# Patient Record
Sex: Female | Born: 2000 | Race: White | Hispanic: No | Marital: Single | Smoking: Never smoker
Health system: Southern US, Community
[De-identification: ages and names within clinical notes are randomized; demographics above are authoritative.]

## PROBLEM LIST (undated history)

## (undated) DIAGNOSIS — K219 Gastro-esophageal reflux disease without esophagitis: Secondary | ICD-10-CM

## (undated) DIAGNOSIS — M419 Scoliosis, unspecified: Secondary | ICD-10-CM

## (undated) HISTORY — PX: WISDOM TOOTH EXTRACTION: SHX21

## (undated) HISTORY — DX: Gastro-esophageal reflux disease without esophagitis: K21.9

## (undated) HISTORY — DX: Scoliosis, unspecified: M41.9

---
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
## 2022-02-08 DIAGNOSIS — Z3041 Encounter for surveillance of contraceptive pills: Secondary | ICD-10-CM | POA: Diagnosis not present

## 2022-02-22 ENCOUNTER — Other Ambulatory Visit: Payer: Self-pay | Admitting: Gastroenterology

## 2022-02-24 ENCOUNTER — Encounter (INDEPENDENT_AMBULATORY_CARE_PROVIDER_SITE_OTHER): Payer: Self-pay

## 2022-04-26 DIAGNOSIS — M41125 Adolescent idiopathic scoliosis, thoracolumbar region: Secondary | ICD-10-CM | POA: Diagnosis not present

## 2022-09-14 NOTE — Progress Notes (Signed)
Brittney Gross    [id number]    2000/10/25  Primary Care Physician:Tucker, Lanora Manis, MD  Referring Physician: Dahlia Byes, MD [address] [place],  [place] [address]   Chief complaint:  GERD Chief Complaint  Patient presents with   Gastroesophageal Reflux    Prilosec is managing her Sx.Triggered by foods, stress   HPI: 22 year old very pleasant female here for a follow up for chronic GERD. She was last seen on 04-12-21.  Today, she states that her reflux symptoms are about the same. She is currently taking Prilosec 20 mg daily. Her reflux symptoms tends to occur with certain foods such as acidic foods, tomatoes, seasonings. She also states that stress tends to worsen her symptoms. She is also experiencing food regurgitation about once daily or one meal out of three meals. Her reflux and regurgitation symptoms tends to occur with certain foods such as acidic foods, tomatoes, or seasonings. She is also occasionally experiencing cobblestone throat and hoarseness that she believes is due to her reflux.   She also complains of chronic constipation. She has been constipated for about a week now. She is taking Cilium to help relief her symptoms. She is also taking Balance of nature which seems to greatly improve her symptoms   She does report having scoliosis and states that she is considering surgery depending on how her symptoms progress.    Patient denies diarrhea, nausea, blood in stool, black stool, vomiting, abdominal pain, bloating, unintentional weight loss, dysphagia.   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 Gus Puma. They opted to hold off surgery to correct the malrotation  Laryngoscopy 04-02-20 On fiberoptic laryngoscopy the vocal cords are clear bilaterally with  normal vocal cord mobility.  The hypopharynx is clear with no significant  mucosal  abnormalities noted.   EGD 03/18/20 - Normal esophagus. Biopsied. - Normal stomach.Biopsied. No evidence of hiatal hernia. - Normal examined duodenum. Biopsied. -Cobblestoning prior to examination of the GI tract.  Biopsies negative for any specific abnormality, no H. pylori, celiac disease or inflammatory bowel disease  Upper GI series March 03, 2020 Abnormal course of the duodenal sweep and proximal small bowel with the duodenal jejunal junction located to the right of midline. Findings are compatible with malrotation. Possible tiny sliding hiatal hernia. Thoracolumbar levoscoliosis. Otherwise unremarkable upper GI series    She has family history of ulcerative colitis and ankylosing spondylitis in her mother.    Current Outpatient Medications:    amphetamine-dextroamphetamine (ADDERALL XR) 10 MG 24 hr capsule, Take 10 mg by mouth every Monday, Tuesday, Wednesday, Thursday, and Friday. In the morning, Disp: , Rfl:    famotidine (PEPCID) 20 MG tablet, Take 1 tablet (20 mg total) by mouth at bedtime. As needed, Disp: 30 tablet, Rfl: 3   Multiple Vitamin (MULTIVITAMIN) tablet, Take 1 tablet by mouth daily., Disp: , Rfl:    norethindrone-ethinyl estradiol (LOESTRIN) 1-20 MG-MCG tablet, Take 1 tablet by mouth daily., Disp: , Rfl:    omeprazole (PRILOSEC) 20 MG capsule, TAKE 1 CAPSULE BY MOUTH DAILY BEFORE BREAKFAST., Disp: 90 capsule, Rfl: 2   PSYLLIUM HUSK PO, Take 500 mg by mouth at bedtime., Disp: , Rfl:    spironolactone (ALDACTONE) 25 MG tablet, Take 50 mg by mouth once., Disp: , Rfl:    sucralfate (CARAFATE) 1 g tablet, Take 1 tablet (1 g total) by mouth  2 (two) times daily. (Patient taking differently: Take 1 g by mouth 2 (two) times daily as needed.), Disp: 60 tablet, Rfl: 1   VITAMIN D PO, Take by mouth., Disp: , Rfl:     Allergies as of 09/19/2022   (No Known Allergies)    Past Medical History:  Diagnosis Date   Constipation    GERD (gastroesophageal reflux  disease)    Scoliosis     Past Surgical History:  Procedure Laterality Date   BIOPSY  03/18/2020   Procedure: BIOPSY;  Surgeon: Patrica Duel, MD;  Location: [facility] ENDOSCOPY;  Service: Gastroenterology;;   ESOPHAGOGASTRODUODENOSCOPY N/A 03/18/2020   Procedure: ESOPHAGOGASTRODUODENOSCOPY (EGD);  Surgeon: Patrica Duel, MD;  Location[facility] ENDOSCOPY;  Service: Gastroenterology;  Laterality: N/A;   WISDOM TOOTH EXTRACTION      Family History  Problem Relation Age of Onset   Ulcerative colitis Mother    Ankylosing spondylitis Mother    Diverticulitis Other    Irritable bowel syndrome Other    Colon cancer Neg Hx    Esophageal cancer Neg Hx    Rectal cancer Neg Hx     Social History   Socioeconomic History   Marital status: Single    Spouse name: Not on file   Number of children: 0   Years of education: Not on file   Highest education level: Not on file  Occupational History   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   She is unsure what she would like to do after graduation.    Lives with mom, dad, 2 sisters, 1 dog   Social Determinants of Health   Financial Resource Strain: Not on file  Food Insecurity: Not on file  Transportation Needs: Not on file  Physical Activity: Not on file  Stress: Not on file  Social Connections: Not on file  Intimate Partner Violence: Not on file    Review of systems: Review of Systems  Constitutional:  Negative for unexpected weight change.  HENT:  Positive for voice change. Negative for trouble swallowing.   Gastrointestinal:  Positive for constipation. Negative for abdominal distention, abdominal pain, anal  bleeding, blood in stool, diarrhea, nausea, rectal pain and vomiting.       +food regurgitation  +reflux +Cobblestone throat      Physical Exam: There were no vitals filed for this visit. There is no height or weight on file to calculate BMI. General: well-appearing   Eyes: sclera anicteric, no redness ENT: oral mucosa moist without lesions, no cervical or supraclavicular lymphadenopathy CV: RRR, no JVD, no peripheral edema Resp: clear to auscultation bilaterally, normal RR and effort noted GI: soft, no tenderness, with active bowel sounds. No guarding or palpable organomegaly noted. Skin; warm and dry, no rash or jaundice noted Neuro: awake, alert and oriented x 3. Normal gross motor function and fluent speech  Data Reviewed:  Reviewed labs, radiology imaging, old records and pertinent past GI work up   Assessment and Plan/Recommendations: 22 yr old very pleasant female with h/o small bowel malrotation, scoliosis, small sliding hiatal hernia and chronic GERD  Continue with anti reflux measures and dietary modification  Use  Omeprazole 20 mg daily in the AM and pepcid 20mg  in the evening as needed Use Carafate 1 gm before meals and bedtime as needed for breakthrough heartburn and epigastric discomfort We will do a trial of low-dose baclofen at bedtime to see if it improves lower esophageal sphincter tone and decreases regurgitation  Chronic idiopathic constipation: Advised patient to try Benefiber 1 tablespoon 3 times daily with meals instead of Psyllium husk Use stool softener, docusate at bedtime as needed to prevent constipation  Return in 6 months   This visit required 40 minutes of patient care (this includes precharting, chart review, review of results, face-to-face time used for counseling as well as treatment plan and follow-up. The patient was provided an opportunity to ask questions and all were answered. The patient agreed with the plan and demonstrated an  understanding of the instructions.    I,Safa M Kadhim,acting as a scribe for Marsa Aris, MD.,have documented all relevant documentation on the behalf of Marsa Aris, MD,as directed by  Marsa Aris, MD while in the presence of Marsa Aris, MD.   I, Marsa Aris, MD, have reviewed all documentation for this visit. The documentation on 09/14/22 for the exam, diagnosis, procedures, and orders are all accurate and complete.   Iona Beard , MD    CC: Dahlia Byes, MD

## 2022-09-19 ENCOUNTER — Encounter: Payer: Self-pay | Admitting: Gastroenterology

## 2022-09-19 ENCOUNTER — Ambulatory Visit (INDEPENDENT_AMBULATORY_CARE_PROVIDER_SITE_OTHER): Payer: 59 | Admitting: Gastroenterology

## 2022-09-19 VITALS — BP 96/66 | HR 94 | Ht 66.0 in | Wt 124.6 lb

## 2022-09-19 DIAGNOSIS — K5904 Chronic idiopathic constipation: Secondary | ICD-10-CM | POA: Diagnosis not present

## 2022-09-19 DIAGNOSIS — K219 Gastro-esophageal reflux disease without esophagitis: Secondary | ICD-10-CM

## 2022-09-19 MED ORDER — SUCRALFATE 1 G PO TABS: 1.0000 g | ORAL_TABLET | Freq: Three times a day (TID) | ORAL | 3 refills | Status: AC

## 2022-09-19 MED ORDER — FAMOTIDINE 20 MG PO TABS: 20.0000 mg | ORAL_TABLET | Freq: Every day | ORAL | 3 refills | Status: AC

## 2022-09-19 MED ORDER — OMEPRAZOLE 20 MG PO CPDR: 20.0000 mg | DELAYED_RELEASE_CAPSULE | Freq: Every day | ORAL | 3 refills | Status: AC

## 2022-09-19 MED ORDER — BACLOFEN 5 MG PO TABS: 5.0000 mg | ORAL_TABLET | Freq: Every evening | ORAL | 6 refills | Status: AC | PRN

## 2022-09-19 NOTE — Patient Instructions (Signed)
We have sent the following medications to your pharmacy for you to pick up at your convenience: Prilosec,Pepcid,Carafate,Baclofen   Take Benefiber 1 teaspoon three times a day  Use Over the Counter Docusate Sodium every night at bedtime as needed  _______________________________________________________  If your blood pressure at your visit was 140/90 or greater, please contact your primary care physician to follow up on this.  _______________________________________________________  If you are age 20 or older, your body mass index should be between 23-30. Your Body mass index is 20.11 kg/m. If this is out of the aforementioned range listed, please consider follow up with your Primary Care Provider.  If you are age 75 or younger, your body mass index should be between 19-25. Your Body mass index is 20.11 kg/m. If this is out of the aformentioned range listed, please consider follow up with your Primary Care Provider.   ________________________________________________________  The [facility] providers would like to encourage you to use [facility] to communicate with providers for non-urgent requests or questions.  Due to long hold times on the telephone, sending your provider a message by [facility] may be a faster and more efficient way to get a response.  Please allow 48 business hours for a response.  Please remember that this is for non-urgent requests.  _______________________________________________________   I appreciate the  opportunity to care for you  Thank You   Marsa Aris , MD

## 2022-09-20 DIAGNOSIS — H5702 Anisocoria: Secondary | ICD-10-CM | POA: Diagnosis not present

## 2022-09-20 DIAGNOSIS — H52529 Paresis of accommodation, unspecified eye: Secondary | ICD-10-CM | POA: Diagnosis not present

## 2022-09-30 ENCOUNTER — Encounter: Payer: Self-pay | Admitting: Gastroenterology

## 2022-10-17 DIAGNOSIS — Z13220 Encounter for screening for lipoid disorders: Secondary | ICD-10-CM | POA: Diagnosis not present

## 2023-02-02 DIAGNOSIS — Z130 Encounter for screening for diseases of the blood and blood-forming organs and certain disorders involving the immune mechanism: Secondary | ICD-10-CM | POA: Diagnosis not present

## 2023-02-02 DIAGNOSIS — Z01419 Encounter for gynecological examination (general) (routine) without abnormal findings: Secondary | ICD-10-CM | POA: Diagnosis not present

## 2023-02-02 DIAGNOSIS — Z1389 Encounter for screening for other disorder: Secondary | ICD-10-CM | POA: Diagnosis not present

## 2023-02-02 DIAGNOSIS — Z01411 Encounter for gynecological examination (general) (routine) with abnormal findings: Secondary | ICD-10-CM | POA: Diagnosis not present

## 2023-02-02 DIAGNOSIS — Z3041 Encounter for surveillance of contraceptive pills: Secondary | ICD-10-CM | POA: Diagnosis not present

## 2023-02-02 DIAGNOSIS — L989 Disorder of the skin and subcutaneous tissue, unspecified: Secondary | ICD-10-CM | POA: Diagnosis not present

## 2023-03-28 DIAGNOSIS — M41125 Adolescent idiopathic scoliosis, thoracolumbar region: Secondary | ICD-10-CM | POA: Diagnosis not present

## 2023-04-21 DIAGNOSIS — N926 Irregular menstruation, unspecified: Secondary | ICD-10-CM | POA: Diagnosis not present

## 2023-04-21 DIAGNOSIS — L709 Acne, unspecified: Secondary | ICD-10-CM | POA: Diagnosis not present

## 2023-04-21 DIAGNOSIS — K219 Gastro-esophageal reflux disease without esophagitis: Secondary | ICD-10-CM | POA: Diagnosis not present

## 2023-05-13 IMAGING — MR MR HEAD WO/W CM
13 series · 48 of 48 positions shown · IV contrast (multihance)
Comparison: None.

CLINICAL DATA: Anisocoria, rule out midbrain or ocular mass.

History of scoliosis, rule out neck mass.
Family history of neurofibromatosis.
EXAM:
MRI HEAD WITHOUT AND WITH CONTRAST
MRI CERVICAL SPINE WITHOUT AND WITH CONTRAST
TECHNIQUE: Multiplanar, multiecho pulse sequences of the brain and surrounding
structures, and cervical spine, to include the craniocervical
junction and cervicothoracic junction, were obtained without and
with intravenous contrast.
CONTRAST:  10mL MULTIHANCE GADOBENATE DIMEGLUMINE 529 MG/ML IV SOLN

[Series 2: T1 · sagittal · 5.0mm · 0.45mm/px · 1 of 26 slices shown]
[im 1/26]
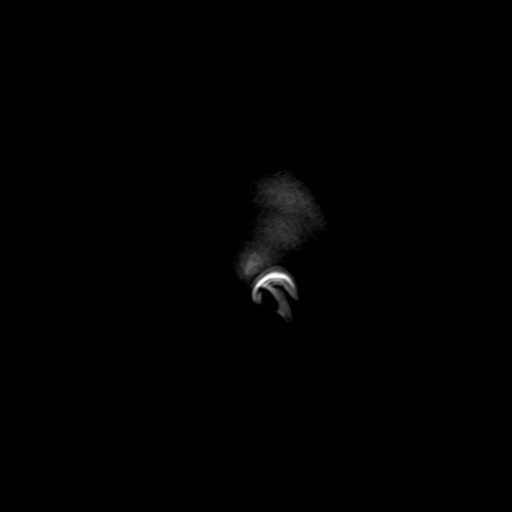

[Series 3: ax ep2d_diff_3 · axial · 3.0mm · 1.88mm/px · z∈[-94,+59]mm · 6 of 108 slices shown]
[im 1/108]
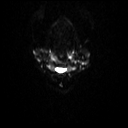
[im 22/108]
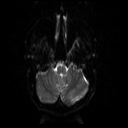
[im 43/108]
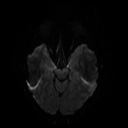
[im 65/108]
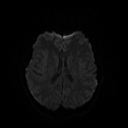
[im 86/108]
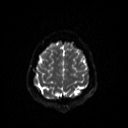
[im 108/108]
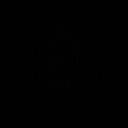

[Series 4: ax ep2d_diff_3_adc · axial · 3.0mm · 1.88mm/px · z∈[-94,+56]mm · 3 of 54 slices shown]
[im 1/54]
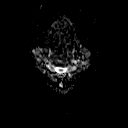
[im 27/54]
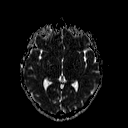
[im 54/54]
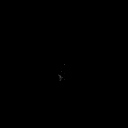

[Series 5: cor ep2d_diff · coronal · 5.0mm · 1.77mm/px · 3 of 57 slices shown]
[im 1/57]
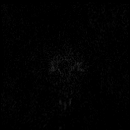
[im 29/57]
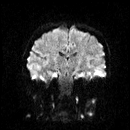
[im 57/57]
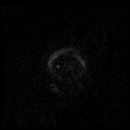

[Series 6: cor ep2d_diff_adc · coronal · 5.0mm · 1.77mm/px · 2 of 28 slices shown]
[im 1/28]
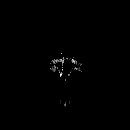
[im 28/28]
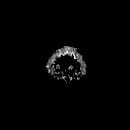

[Series 8: swi_images · axial · 2.0mm · 0.94mm/px · z∈[-93,+57]mm · 5 of 80 slices shown]
[im 1/80]
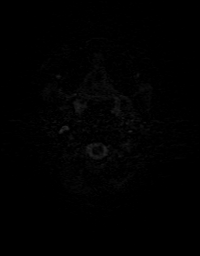
[im 20/80]
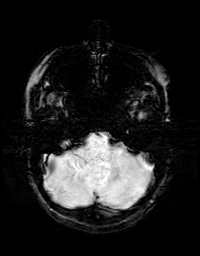
[im 40/80]
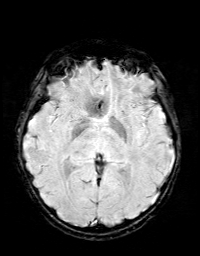
[im 60/80]
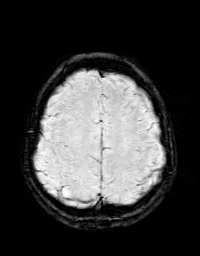
[im 80/80]
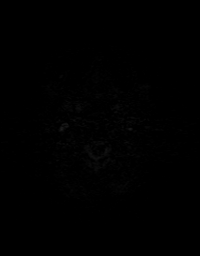

[Series 9: FLAIR · axial · 3.0mm · 0.45mm/px · z∈[-88,+56]mm · 2 of 40 slices shown]
[im 1/40]
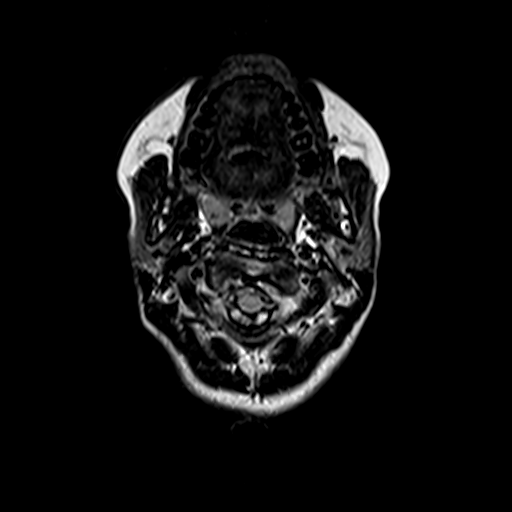
[im 40/40]
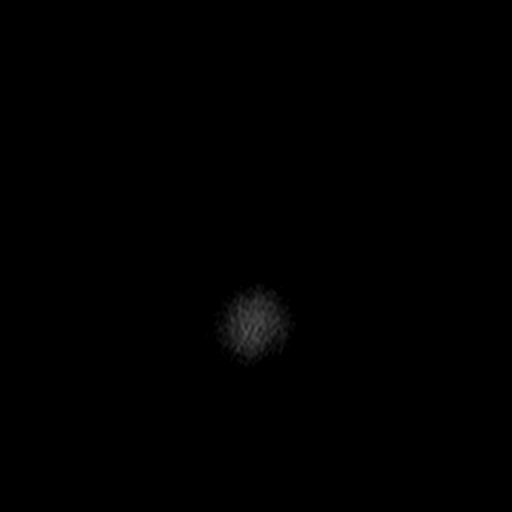

[Series 10: T2 · axial · 5.0mm · 0.62mm/px · z∈[-95,+59]mm · 2 of 28 slices shown]
[im 1/28]
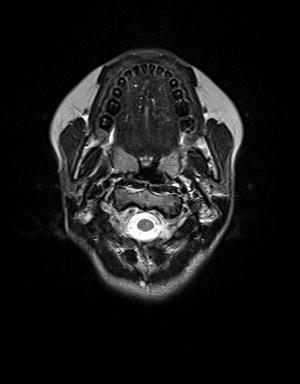
[im 28/28]
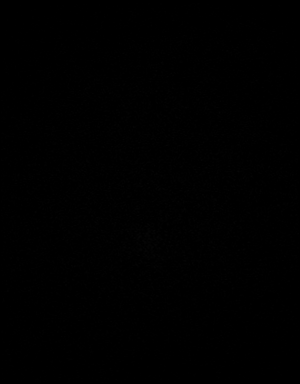

[Series 11: t1_mpr_tra · axial · 1.0mm · 0.75mm/px · z∈[-93,+57]mm · 9 of 160 slices shown]
[im 1/160]
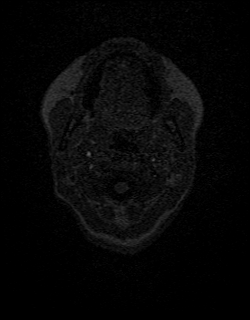
[im 20/160]
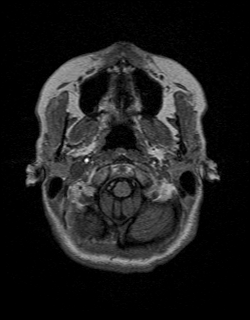
[im 40/160]
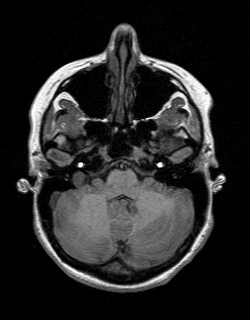
[im 60/160]
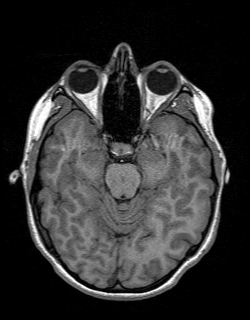
[im 80/160]
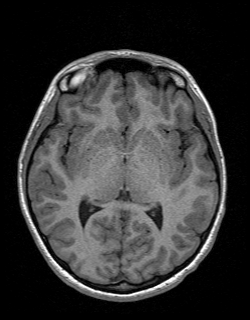
[im 100/160]
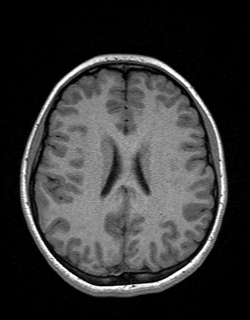
[im 120/160]
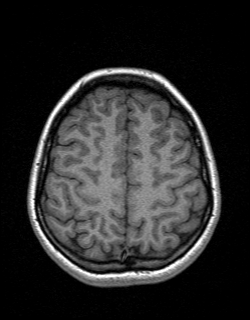
[im 140/160]
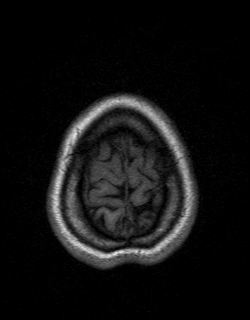
[im 160/160]
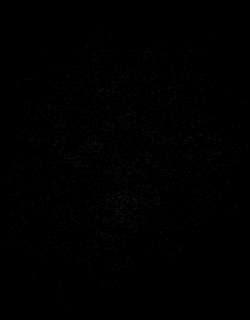

[Series 12: T2 post-contrast · coronal · 5.0mm · 0.45mm/px · 2 of 29 slices shown]
[im 1/29]
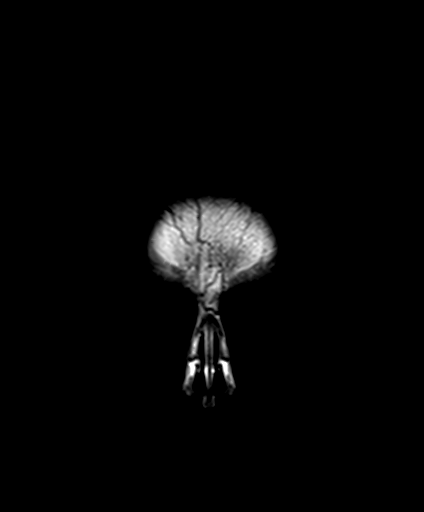
[im 29/29]
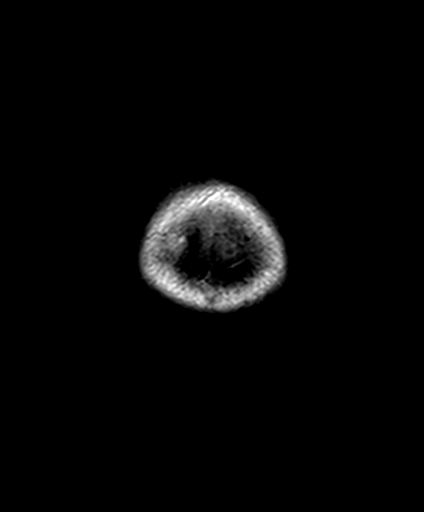

[Series 13: T1 post-contrast · coronal · 5.0mm · 0.90mm/px · 2 of 29 slices shown]
[im 1/29]
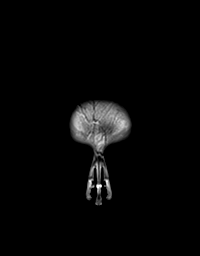
[im 29/29]
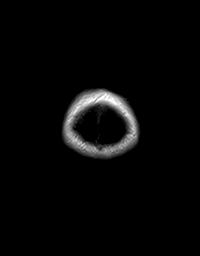

[Series 14: post t1_mpr_tra · axial · 1.0mm · 0.75mm/px · z∈[-93,+57]mm · 9 of 160 slices shown]
[im 1/160]
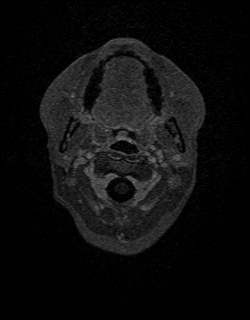
[im 20/160]
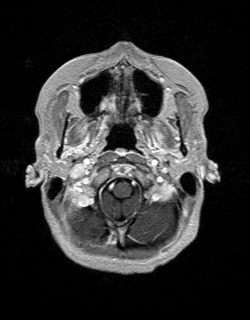
[im 40/160]
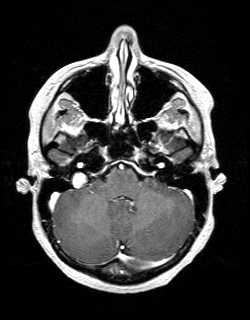
[im 60/160]
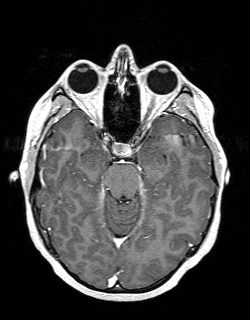
[im 80/160]
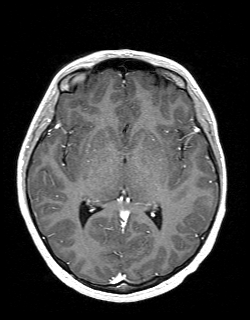
[im 100/160]
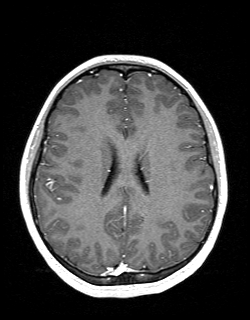
[im 120/160]
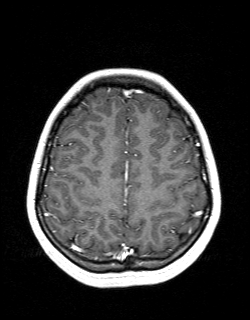
[im 140/160]
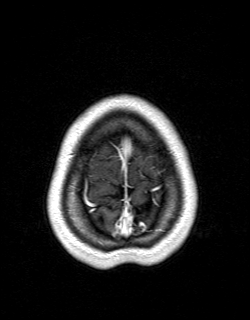
[im 160/160]
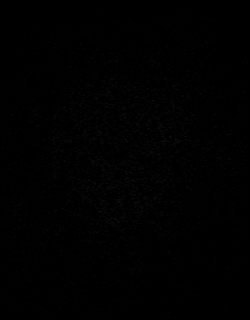

[Series 15: t1_se_sag post · sagittal · 5.0mm · 0.45mm/px · 2 of 26 slices shown]
[im 1/26]
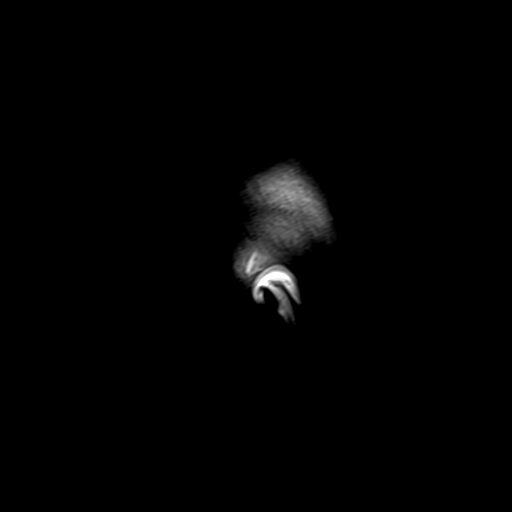
[im 26/26]
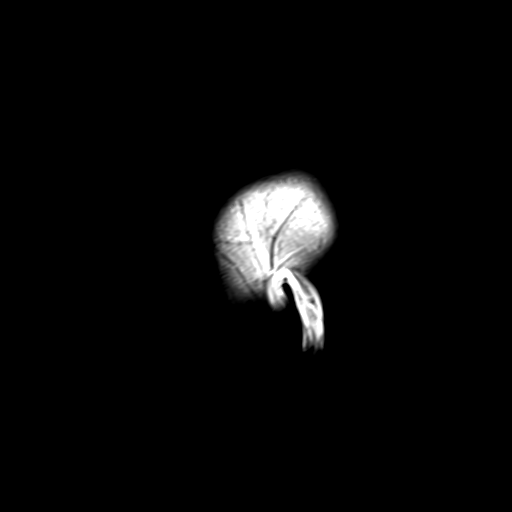

[48 of 48 positions shown; findings below may reference images not displayed]

FINDINGS: MRI HEAD FINDINGS

Brain: No infarction, hemorrhage, hydrocephalus, extra-axial
collection or mass lesion. No white matter disease or atrophy. No
abnormal enhancement

Vascular: Normal flow voids and vascular enhancements

Skull and upper cervical spine: Normal marrow signal

Sinuses/Orbits: Negative.  No orbital mass or visible inflammation.

MRI CERVICAL SPINE FINDINGS

Alignment: Mild levocurvature, likely compensatory to the thoracic
curve. Reversal of cervical lordosis.

Vertebrae: No fracture, evidence of discitis, or bone lesion.

Cord: Normal signal and morphology.

Posterior Fossa, vertebral arteries, paraspinal tissues: Negative.

Disc levels: Well preserved disc height and hydration. No facet
spurring. Widely patent canal and foramina.
IMPRESSION: 1. Negative MRI of the brain.
2. Mild cervical scoliosis. No underlying mass or segmentation
anomaly.

## 2023-10-11 ENCOUNTER — Encounter: Payer: Self-pay | Admitting: Plastic Surgery

## 2023-10-11 ENCOUNTER — Ambulatory Visit (INDEPENDENT_AMBULATORY_CARE_PROVIDER_SITE_OTHER): Payer: Self-pay | Admitting: Plastic Surgery

## 2023-10-11 DIAGNOSIS — Z719 Counseling, unspecified: Secondary | ICD-10-CM | POA: Insufficient documentation

## 2023-10-11 NOTE — Progress Notes (Signed)
 Preoperative Dx: vascular lesion of forehead  Postoperative Dx:  same  Procedure: laser to forehead vascular lesion   Anesthesia: none  Description of Procedure:  Risks and complications were explained to the patient. Consent was confirmed and signed. Eye protection was placed. Time out was called and all information was confirmed to be correct. The area  area was prepped with alcohol and wiped dry. The Heroic laser was set at 560 nm and 20 J/cm2. The lesion was lasered. The patient tolerated the procedure well and there were no complications. The patient is to follow up in 4 weeks.

## 2023-10-19 DIAGNOSIS — E7800 Pure hypercholesterolemia, unspecified: Secondary | ICD-10-CM | POA: Diagnosis not present

## 2023-10-19 DIAGNOSIS — N926 Irregular menstruation, unspecified: Secondary | ICD-10-CM | POA: Diagnosis not present

## 2023-10-25 ENCOUNTER — Encounter (INDEPENDENT_AMBULATORY_CARE_PROVIDER_SITE_OTHER): Payer: Self-pay

## 2023-10-26 ENCOUNTER — Encounter (INDEPENDENT_AMBULATORY_CARE_PROVIDER_SITE_OTHER): Payer: Self-pay

## 2023-12-14 ENCOUNTER — Ambulatory Visit: Admitting: Gastroenterology

## 2023-12-14 ENCOUNTER — Encounter: Payer: Self-pay | Admitting: Gastroenterology

## 2023-12-14 VITALS — BP 100/60 | HR 94 | Ht 66.0 in | Wt 122.0 lb

## 2023-12-14 DIAGNOSIS — R0789 Other chest pain: Secondary | ICD-10-CM | POA: Diagnosis not present

## 2023-12-14 DIAGNOSIS — I493 Ventricular premature depolarization: Secondary | ICD-10-CM

## 2023-12-14 DIAGNOSIS — K219 Gastro-esophageal reflux disease without esophagitis: Secondary | ICD-10-CM | POA: Diagnosis not present

## 2023-12-14 DIAGNOSIS — R0609 Other forms of dyspnea: Secondary | ICD-10-CM | POA: Diagnosis not present

## 2023-12-14 DIAGNOSIS — M419 Scoliosis, unspecified: Secondary | ICD-10-CM | POA: Diagnosis not present

## 2023-12-14 DIAGNOSIS — R002 Palpitations: Secondary | ICD-10-CM | POA: Diagnosis not present

## 2023-12-14 DIAGNOSIS — R079 Chest pain, unspecified: Secondary | ICD-10-CM

## 2023-12-14 DIAGNOSIS — K5900 Constipation, unspecified: Secondary | ICD-10-CM

## 2023-12-14 NOTE — Progress Notes (Signed)
 Brittney Gross    [id number]    August 06, 2000  Primary Care Physician:Pahwani, Velna [name], MD  Referring Physician: Vernon Velna [name], MD [address] [place],  [place] [address]   Chief complaint:  GERD  Discussed the use of AI scribe software for clinical note transcription with the patient, who gave verbal consent to proceed.  History of Present Illness Brittney Gross is a 23 year old female with a history of acid reflux and scoliosis who presents with gastrointestinal symptoms and potential back surgery.  Gastroesophageal reflux symptoms - Acid reflux symptoms are well-managed with famotidine , taken as needed, especially at night to prevent flares. - Avoids dietary triggers to manage symptoms effectively. - Has made dietary changes to support gastrointestinal health, focusing on a varied diet with fresh fruits and vegetables and reduced processed food intake. - No current exacerbation of acid reflux symptoms.  Cardiorespiratory symptoms - New onset palpitations and shortness of breath, increasing in frequency over the past three days. - Episode of chest pain, dizziness, and heart racing after climbing stairs. - History of premature ventricular contractions (PVCs). - No recent viral infections. - Does not believe symptoms are due to panic attacks.  Spinal deformity and surgical planning - Scoliosis with consideration for back surgery, which was postponed last year. - Plans to undergo back surgery after graduating in May. - Concerned about potential impact of back surgery on gastrointestinal symptoms. - Previously tried physical therapy and other non-surgical interventions without success.    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 Chuckie. They opted to hold off surgery to correct the malrotation   Laryngoscopy 04-02-20 On fiberoptic laryngoscopy the  vocal cords are clear bilaterally with  normal vocal cord mobility.  The hypopharynx is clear with no significant  mucosal abnormalities noted.    EGD 03/18/20 - Normal esophagus. Biopsied. - Normal stomach.Biopsied. No evidence of hiatal hernia. - Normal examined duodenum. Biopsied. -Cobblestoning prior to examination of the GI tract.   Biopsies negative for any specific abnormality, no H. pylori, celiac disease or inflammatory bowel disease   Upper GI series March 03, 2020 Abnormal course of the duodenal sweep and proximal small bowel with the duodenal jejunal junction located to the right of midline. Findings are compatible with malrotation. Possible tiny sliding hiatal hernia. Thoracolumbar levoscoliosis. Otherwise unremarkable upper GI series  Outpatient Encounter Medications as of 12/14/2023  Medication Sig   amphetamine-dextroamphetamine (ADDERALL XR) 10 MG 24 hr capsule Take 10 mg by mouth every Monday, Tuesday, Wednesday, Thursday, and Friday. In the morning   Baclofen  5 MG TABS Take 1 tablet (5 mg total) by mouth at bedtime as needed.   famotidine  (PEPCID ) 20 MG tablet Take 1 tablet (20 mg total) by mouth at bedtime. As needed   Multiple Vitamin (MULTIVITAMIN) tablet Take 1 tablet by mouth daily.   norethindrone-ethinyl estradiol (LOESTRIN) 1-20 MG-MCG tablet Take 1 tablet by mouth daily.   omeprazole  (PRILOSEC) 20 MG capsule Take 1 capsule (20 mg total) by mouth daily.   OVER THE COUNTER MEDICATION Balance of Nature Fruits and Vegs: 2 of each daily   OVER THE COUNTER MEDICATION Magnesium  100mg  : 2 daily   OVER THE COUNTER MEDICATION Nutrofol: 4 daily   PSYLLIUM HUSK PO Take 500 mg by mouth at bedtime.   spironolactone (ALDACTONE) 25 MG tablet Take 50 mg by  mouth once.   sucralfate  (CARAFATE ) 1 g tablet Take 1 tablet (1 g total) by mouth in the morning, at noon, and at bedtime. As needed   VITAMIN D PO Take by mouth.   No facility-administered encounter medications on  file as of 12/14/2023.    Allergies as of 12/14/2023   (No Known Allergies)    Past Medical History:  Diagnosis Date   Constipation    GERD (gastroesophageal reflux disease)    Scoliosis     Past Surgical History:  Procedure Laterality Date   BIOPSY  03/18/2020   Procedure: BIOPSY;  Surgeon: Rhoda Sis, MD;  Location: [facility] ENDOSCOPY;  Service: Gastroenterology;;   ESOPHAGOGASTRODUODENOSCOPY N/A 03/18/2020   Procedure: ESOPHAGOGASTRODUODENOSCOPY (EGD);  Surgeon: Rhoda Sis, MD;  Location: [facility] ENDOSCOPY;  Service: Gastroenterology;  Laterality: N/A;   WISDOM TOOTH EXTRACTION      Family History  Problem Relation Age of Onset   Ulcerative colitis Mother    Ankylosing spondylitis Mother    Diverticulitis Other    Irritable bowel syndrome Other    Colon cancer Neg Hx    Esophageal cancer Neg Hx    Rectal cancer Neg Hx     Social History   Socioeconomic History   Marital status: Single    Spouse name: Not on file   Number of children: 0   Years of education: Not on file   Highest education level: Not on file  Occupational History   Occupation: college student  Tobacco Use   Smoking status: Never   Smokeless tobacco: Never  Vaping Use   Vaping status: Never Used  Substance and Sexual Activity   Alcohol use: Never   Drug use: Never   Sexual activity: Not Currently  Other Topics Concern   Not on file  Social History Narrative   12th grade home schooled 22-23 school year.   She is unsure what she would like to do after graduation.    Lives with mom, dad, 2 sisters, 1 dog   Social Drivers of Corporate Investment Banker Strain: Low Risk  (03/27/2023)   Received from [facility]   Overall Financial Resource Strain (CARDIA)    Difficulty of Paying Living Expenses: Not hard at all  Food Insecurity: No Food Insecurity (03/27/2023)   Received from [facility]   Hunger Vital Sign    Within the past 12 months, you worried  that your food would run out before you got the money to buy more.: Never true    Within the past 12 months, the food you bought just didn't last and you didn't have money to get more.: Never true  Transportation Needs: No Transportation Needs (03/27/2023)   Received from [facility] - Transportation    In the past 12 months, has lack of transportation kept you from medical appointments or from getting medications?: No    Lack of Transportation (Non-Medical): No  Physical Activity: Not on file  Stress: Not on file  Social Connections: Not on file  Intimate Partner Violence: Not on file      Review of systems: All other review of systems negative except as mentioned in the HPI.   Physical Exam: Vitals:   12/14/23 1010  BP: 100/60  Pulse: 94   Body mass index is 19.69 kg/m. Gen:      No acute distress HEENT:  sclera anicteric CV: s1s2 rrr, no murmur Lungs: B/l clear. Abd:  soft, non-tender; no palpable masses, no distension Ext:    No edema Neuro: alert and oriented x 3 Psych: normal mood and affect  Data Reviewed:  Reviewed labs, radiology imaging, old records and pertinent past GI work up    Assessment & Plan Gastroesophageal reflux disease Well-controlled with famotidine . She is aware of dietary triggers and manages symptoms effectively. - Continue famotidine  as needed for symptom control.  Constipation Managed with stool softeners and dietary modifications. Surgery may temporarily exacerbate constipation due to pain medications. - Continue stool softeners and dietary modifications to maintain regular bowel habits. - Use Miralax as needed to prevent constipation, especially around the time of surgery.  Scoliosis Significant curvature causing asymmetry in shoulder and rib cage. Surgery is planned post-graduation to avoid complications during pregnancy. Risks include temporary gastrointestinal issues post-surgery, but benefits outweigh  risks. - Proceed with planned back surgery post-graduation. - Use stool softeners and Miralax around the time of surgery to manage potential constipation. - Use acid-reducing medications like famotidine  to manage potential reflux exacerbation post-surgery.  Palpitations and exertional shortness of breath, under evaluation Recent onset of palpitations and exertional shortness of breath, with episodes of chest pain and dizziness. Differential includes benign heart rhythms or viral infections. No abnormal heart rhythm or murmurs detected on examination. - Referred to cardiologist for evaluation and potential heart rhythm monitoring.         This visit required >30 minutes of patient care (this includes precharting, chart review, review of results, face-to-face time used for counseling as well as treatment plan and follow-up. The patient was provided an opportunity to ask questions and all were answered. The patient agreed with the plan and demonstrated an understanding of the instructions.  LOIS Wilkie Mcgee , MD    CC: Vernon Velna SAUNDERS, MD

## 2023-12-14 NOTE — Patient Instructions (Addendum)
 VISIT SUMMARY:  During your visit, we discussed your well-managed acid reflux, new onset of palpitations and shortness of breath, and your upcoming back surgery for scoliosis. We also reviewed your current management plan for constipation.  YOUR PLAN:  GASTROESOPHAGEAL REFLUX DISEASE: Your acid reflux symptoms are well-controlled with famotidine  and dietary changes. -Continue taking famotidine  as needed to control symptoms.  CONSTIPATION: Your constipation is managed with stool softeners and dietary modifications. Surgery may temporarily worsen constipation due to pain medications. -Continue using stool softeners and dietary modifications to maintain regular bowel habits. -Use Miralax as needed to prevent constipation, especially around the time of your surgery.  SCOLIOSIS: You have a significant curvature causing asymmetry in your shoulder and rib cage. Surgery is planned after your graduation to avoid complications during pregnancy. -Proceed with your planned back surgery after graduation. -Use stool softeners and Miralax around the time of surgery to manage potential constipation. -Use acid-reducing medications like famotidine  to manage potential reflux exacerbation post-surgery.  PALPITATIONS AND EXERTIONAL SHORTNESS OF BREATH: You have recently experienced palpitations and shortness of breath, with episodes of chest pain and dizziness. No abnormal heart rhythm or murmurs were detected during the examination. -You are referred to a cardiologist for further evaluation and potential heart rhythm monitoring.   We are referring you to Dr Wonda - Cardiology.  They will contact you directly to schedule an appointment.  It may take a week or more before you hear from them.  Please feel free to contact us  if you have not heard from them within 2 weeks and we will follow up on the referral.    Thank you for choosing me and [facility].  Dr Kavitha Nandigam

## 2023-12-26 ENCOUNTER — Encounter: Payer: Self-pay | Admitting: Gastroenterology
